# Patient Record
Sex: Female | Born: 1935 | Race: White | Hispanic: No
Health system: Southern US, Community
[De-identification: ages and names within clinical notes are randomized; demographics above are authoritative.]

---

## 2013-09-25 LAB — COMPREHENSIVE METABOLIC PANEL
ALK PHOS: 83 U/L
Albumin: 4.4 g/dL (ref 3.4–5.0)
Anion Gap: 5 — ABNORMAL LOW (ref 7–16)
BUN: 20 mg/dL — AB (ref 7–18)
Bilirubin,Total: 0.5 mg/dL (ref 0.2–1.0)
CHLORIDE: 107 mmol/L (ref 98–107)
CO2: 27 mmol/L (ref 21–32)
CREATININE: 1.02 mg/dL (ref 0.60–1.30)
Calcium, Total: 9 mg/dL (ref 8.5–10.1)
EGFR (African American): 59 — ABNORMAL LOW
GFR CALC NON AF AMER: 50 — AB
Glucose: 167 mg/dL — ABNORMAL HIGH (ref 65–99)
Osmolality: 284 (ref 275–301)
Potassium: 3.9 mmol/L (ref 3.5–5.1)
SGOT(AST): 28 U/L (ref 15–37)
SGPT (ALT): 21 U/L (ref 12–78)
Sodium: 139 mmol/L (ref 136–145)
TOTAL PROTEIN: 8.1 g/dL (ref 6.4–8.2)

## 2013-09-25 LAB — CBC WITH DIFFERENTIAL/PLATELET
BASOS ABS: 0.1 10*3/uL (ref 0.0–0.1)
Basophil %: 0.4 %
EOS ABS: 0.2 10*3/uL (ref 0.0–0.7)
EOS PCT: 1.2 %
HCT: 49 % — ABNORMAL HIGH (ref 35.0–47.0)
HGB: 15.6 g/dL (ref 12.0–16.0)
LYMPHS ABS: 0.7 10*3/uL — AB (ref 1.0–3.6)
Lymphocyte %: 3.8 %
MCH: 29.9 pg (ref 26.0–34.0)
MCHC: 31.8 g/dL — AB (ref 32.0–36.0)
MCV: 94 fL (ref 80–100)
Monocyte #: 1.4 x10 3/mm — ABNORMAL HIGH (ref 0.2–0.9)
Monocyte %: 7.9 %
NEUTROS PCT: 86.7 %
Neutrophil #: 15.8 10*3/uL — ABNORMAL HIGH (ref 1.4–6.5)
Platelet: 247 10*3/uL (ref 150–440)
RBC: 5.22 10*6/uL — AB (ref 3.80–5.20)
RDW: 12.3 % (ref 11.5–14.5)
WBC: 18.2 10*3/uL — AB (ref 3.6–11.0)

## 2013-09-25 LAB — URINALYSIS, COMPLETE
Bilirubin,UR: NEGATIVE
Glucose,UR: NEGATIVE mg/dL (ref 0–75)
Hyaline Cast: 4
Nitrite: NEGATIVE
Ph: 5 (ref 4.5–8.0)
Protein: 30
RBC,UR: 8 /HPF (ref 0–5)
Specific Gravity: 1.025 (ref 1.003–1.030)
Squamous Epithelial: 7
Transitional Epi: <1

## 2013-09-25 LAB — LIPASE, BLOOD: LIPASE: 152 U/L (ref 73–393)

## 2013-09-26 ENCOUNTER — Inpatient Hospital Stay: Payer: Self-pay | Admitting: Internal Medicine

## 2013-09-26 LAB — CK TOTAL AND CKMB (NOT AT ARMC)
CK, Total: 54 U/L
CK-MB: 0.9 ng/mL (ref 0.5–3.6)

## 2013-09-26 LAB — TROPONIN I: Troponin-I: <0.02 ng/mL

## 2013-09-27 LAB — CBC WITH DIFFERENTIAL/PLATELET
Basophil #: 0 10*3/uL (ref 0.0–0.1)
Basophil %: 0.2 %
Eosinophil #: 0.1 10*3/uL (ref 0.0–0.7)
Eosinophil %: 1.7 %
HCT: 36.1 % (ref 35.0–47.0)
HGB: 11.7 g/dL — AB (ref 12.0–16.0)
Lymphocyte #: 1.3 10*3/uL (ref 1.0–3.6)
Lymphocyte %: 18.1 %
MCH: 30.6 pg (ref 26.0–34.0)
MCHC: 32.5 g/dL (ref 32.0–36.0)
MCV: 94 fL (ref 80–100)
MONO ABS: 1 x10 3/mm — AB (ref 0.2–0.9)
MONOS PCT: 13.6 %
Neutrophil #: 4.9 10*3/uL (ref 1.4–6.5)
Neutrophil %: 66.4 %
Platelet: 168 10*3/uL (ref 150–440)
RBC: 3.83 10*6/uL (ref 3.80–5.20)
RDW: 12.6 % (ref 11.5–14.5)
WBC: 7.3 10*3/uL (ref 3.6–11.0)

## 2013-09-27 LAB — CLOSTRIDIUM DIFFICILE(ARMC)

## 2013-09-27 LAB — BASIC METABOLIC PANEL
Anion Gap: 4 — ABNORMAL LOW (ref 7–16)
BUN: 9 mg/dL (ref 7–18)
CO2: 24 mmol/L (ref 21–32)
Calcium, Total: 7.3 mg/dL — ABNORMAL LOW (ref 8.5–10.1)
Chloride: 117 mmol/L — ABNORMAL HIGH (ref 98–107)
Creatinine: 0.78 mg/dL (ref 0.60–1.30)
GLUCOSE: 99 mg/dL (ref 65–99)
OSMOLALITY: 287 (ref 275–301)
POTASSIUM: 3.3 mmol/L — AB (ref 3.5–5.1)
SODIUM: 145 mmol/L (ref 136–145)

## 2013-09-28 LAB — WBCS, STOOL

## 2013-09-29 LAB — STOOL CULTURE

## 2014-10-01 NOTE — H&P (Signed)
PATIENT NAME:  Heather Mclaughlin, Heather Mclaughlin MR#:  [id number] DATE OF BIRTH:  01-15-1928  DATE OF ADMISSION:  09/26/2013  PRIMARY CARE PHYSICIAN: Nonlocal.   REFERRING PHYSICIAN:  .  CHIEF COMPLAINT: Nausea, vomiting and abdominal pain and diarrhea.   HISTORY OF PRESENT ILLNESS: Heather Mclaughlin is a 81 year old pleasant, white female with history of hypertension, hyperlipidemia, who is visiting her daughter from [place], started to have nausea, vomiting, diarrhea since last night at around 7:00 p.m. Two days back the patient went to [place] area, ate burger at  [employer]. The patient went along with her friend, who is also experiencing similar symptoms. The patient initially attributed this to stomach spasms, took some Zofran without much improvement. As the patient continued to have numerous episodes of vomiting the patient came to the Emergency Department. CT abdomen and pelvis done in the Emergency Department shows the patient has enterocolitis with the thickening of the wall of the distal small bowel and colon with fluid filled. The patient is also found to have elevated WBC count of 18,000. The patient has mild UTI with a 2+ leukocyte esterase and WBC of 8. The patient complains abdominal pain diffusely.   PAST MEDICAL HISTORY: 1. Osteoarthritis.  2. Hypertension.  3. Hypothyroidism.  4. Atrial fibrillation.   ALLERGIES: No known drug allergies.   HOME MEDICATIONS: 1. Zofran 8 mg 1 tablet 2 times a day.  2. Simvastatin 10 mg once a day.  3. Reglan 10 mg  4 times a day.  4. Prilosec 20 mg once a day.  5. Pradaxa 150 mg 2 times a day.  6. Levoxyl 88 mcg once a day.  7. Flecainide 50 mg every 12 hours.  8. Coreg 25 mg 2 times a day.   SOCIAL HISTORY: No history of smoking, drinking alcohol or using illicit drugs. Lives with her son in [place].   FAMILY HISTORY: Hypertension, hypothyroidism.   REVIEW OF SYSTEMS: CONSTITUTIONAL: Generalized weakness.  EYES: No change in vision.  ENT: No change  in hearing.  RESPIRATORY: No cough, shortness of breath.  CARDIOVASCULAR: No chest pain, palpations.  GASTROINTESTINAL: Has nausea, vomiting and diarrhea.  GENITOURINARY: No dysuria or hematuria.  MUSCULOSKELETAL: No joint pains and aches. Has history of osteoarthritis.  SKIN: No rashes or lesions.  NEUROLOGIC: No weakness or numbness in any part of the body.   PHYSICAL EXAMINATION: GENERAL: This is a well built, well-nourished, age-appropriate female lying down in the bed, not in distress.  VITAL SIGNS: Temperature 97.9, pulse 88, blood pressure 179/75, respiratory rate of 20, oxygen saturation is 98% on room air.  HEENT: Head normocephalic, atraumatic. There is no scleral icterus. Conjunctivae normal. Pupils equal and react to light. Extraocular movements are intact. Mucous membranes dry. No pharyngeal erythema.  NECK: Supple. No lymphadenopathy. No JVD. No carotid bruit. No thyromegaly.  CHEST: Has no focal tenderness.  LUNGS: Clear to auscultation.  HEART: S1, S2 regular. No murmurs are heard.  ABDOMEN: Bowel sounds present. Soft, mildly tender diffusely. No guarding or rebound tenderness. Could not appreciate any hepatosplenomegaly.  EXTREMITIES: No pedal edema. Pulses 2+. SKIN: No rash or lesions.  MUSCULOSKELETAL: Good range of motion in all the extremities.  NEUROLOGICAL: The patient is alert, oriented to place, person and time. Cranial nerves II through XII intact. Motor 5/5 in upper and lower extremities.   LABORATORIES: Lipase 152. Complete metabolic panel is completely within normal limits. CBC: WBC of 18.2, hemoglobin 15.6, platelet count of 247. Urinalysis, 2+ leukocyte esterase, WBC of 8, trace bacteria.  CT abdomen and pelvis consistent with enterocolitis.   ASSESSMENT AND PLAN: Heather Mclaughlin is a 81 year old female who comes with gastroenteritis from food intoxication.  1. Enterocolitis with gastritis. Keep the patient n.p.o. Continue with IV fluids. Keep the patient on  ciprofloxacin and Flagyl.  2. Hypertension, poorly controlled as the patient is unable to keep down any of her medications. We will keep her on p.r.n. antihypertensive medications if blood pressure goes over 160 mmHg.  3. Atrial fibrillation: Currently rate is well controlled. The patient is on Pradaxa for anticoagulation.  4. Urinary tract infection, mild Cipro should cover this. We will also obtain urine cultures.  5. The patient is on Pradaxa which should cover for deep vein prophylaxis.   TIME SPENT: 50 minutes.    ____________________________ Susa GriffinsPadmaja Legrand Lasser, MD pv:sg D: 09/26/2013 07:44:00 ET T: 09/26/2013 08:17:08 ET JOB#: [id number]  cc: Susa GriffinsPadmaja Ayriana Wix, MD, <Dictator> Susa GriffinsPADMAJA Yvette Roark MD ELECTRONICALLY SIGNED 10/07/2013 20:54

## 2014-10-01 NOTE — Discharge Summary (Signed)
PATIENT NAME:  Heather Mclaughlin, Scarlett MR#:  [id number] DATE OF BIRTH:  06-14-1927  DATE OF ADMISSION:  09/26/2013 DATE OF DISCHARGE:  09/28/2013  PRESENTING COMPLAINT: Nausea, vomiting and diarrhea.   DISCHARGE DIAGNOSES:  1.  Acute enterocolitis, improved.  2.  Chronic atrial fibrillation, on Pradaxa.  3.  Hypertension.  4.  Chronic atrial fibrillation.  CODE STATUS: Full code.  DISCHARGE MEDICATIONS:   1.  Pradaxa 150 mg p.o. b.i.d. 2.  Simvastatin 10 mg at bedtime. 3.  Levoxyl 88 mcg p.o. daily. 4.  Flecainide 50 mg b.i.d. 5.  Carvedilol 25 mg b.i.d. 6.  Zofran 8 mg 1 tablet b.i.d. 7.  Reglan 10 mg 1 tablet 4 times a day as needed. 8.  Prilosec 20 mg daily. 9.  Flagyl 500 mg 1 tablet 3 times a day. 10. Cipro 500 mg b.i.d.   DISCHARGE DIET: Low-sodium, mechanical soft.   FOLLOWUP: With PCP upon return to [place].   Stool for C. difficile was negative. Stool culture was negative. White count was 7.3.   BRIEF SUMMARY OF HOSPITAL COURSE: [name] is an 81 year old Caucasian female with history of chronic atrial fibrillation, on Pradaxa and hypertension, who came in with nausea, vomiting and diarrhea. She was admitted with:  1.  Acute enterocolitis. She was initially hypotensive with blood pressure in the 80s. She was started on Flagyl and Cipro. C. difficile and stool cultures were negative. White count is normal. Her stool was more formed prior to discharge.  2.  A. Fib. Continued Coreg, Flecainide and Pradaxa.  3.  Chronic nausea. Continued Zofran and Reglan.  4.  Hospital stay otherwise remained stable.   CODE STATUS: The patient remained a full code.   TIME SPENT: 40 minutes.  ____________________________ Wylie HailSona A. Allena KatzPatel, MD sap:aw D: 09/29/2013 07:12:13 ET T: 09/29/2013 07:51:30 ET JOB#: [id number]  cc: Farhan Jean A. Allena KatzPatel, MD, <Dictator> Willow OraSONA A Braelee Herrle MD ELECTRONICALLY SIGNED 10/04/2013 9:13

## 2015-10-06 IMAGING — CT CT ABD-PELV W/ CM
2 of 6 series · 15 of 46 positions shown, 17 images · IV contrast (agent unspecified)
Comparison: None.

CLINICAL DATA: Nausea and vomiting for 4 hours, possibly related to
a food allergy.

EXAM:
CT ABDOMEN AND PELVIS WITH CONTRAST
TECHNIQUE: Multidetector CT imaging of the abdomen and pelvis was performed
using the standard protocol following bolus administration of
intravenous contrast.
CONTRAST:  100 mL [id number]

[Series 2: routine abd pel with · axial · 0.77mm/px · z∈[-916,-486]mm · 12 of 98 slices shown, 14 images]
[im 6/98  soft-tissue]
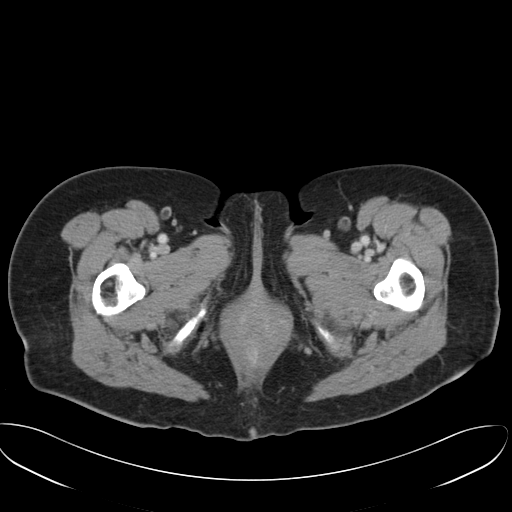
[im 6/98  bone]
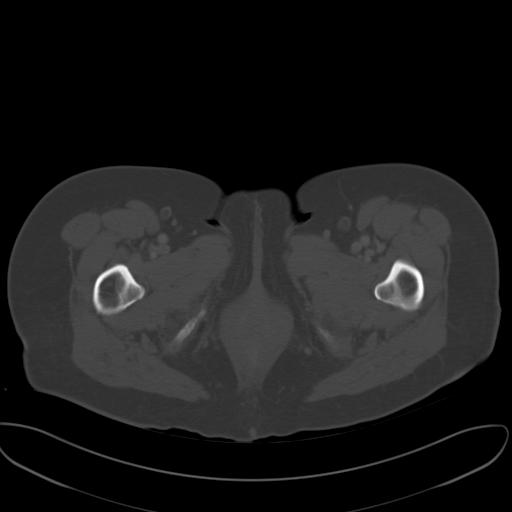
[im 17/98  soft-tissue]
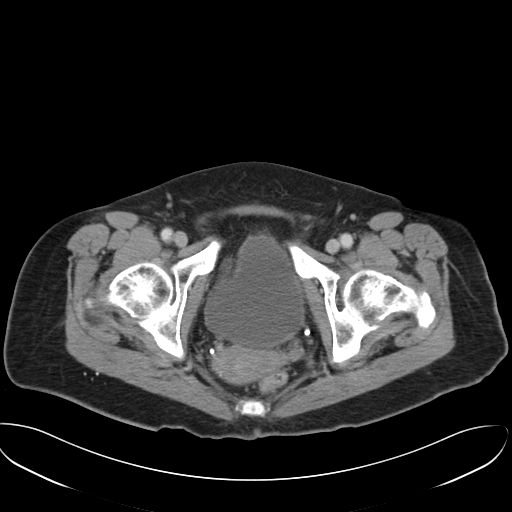
[im 22/98  soft-tissue]
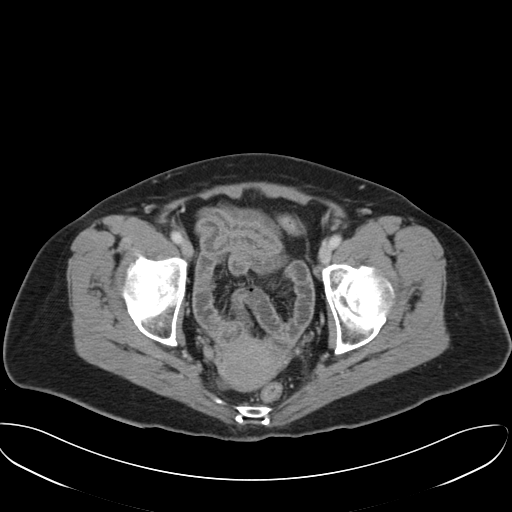
[im 27/98  soft-tissue]
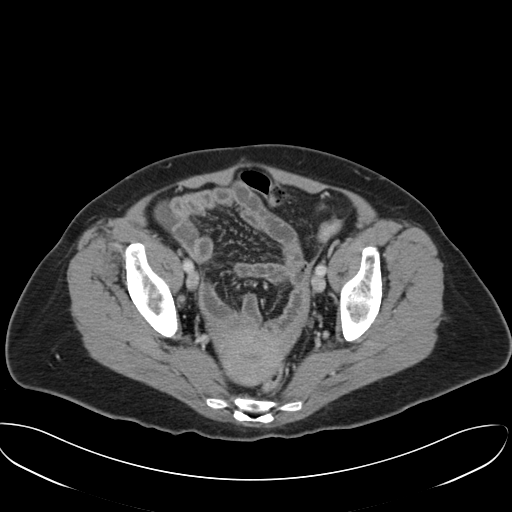
[im 38/98  soft-tissue]
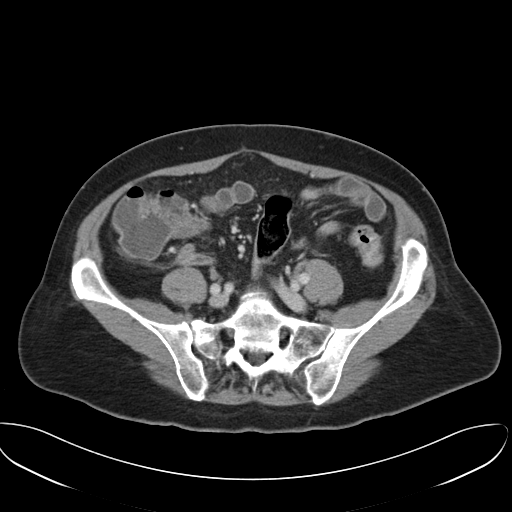
[im 44/98  soft-tissue]
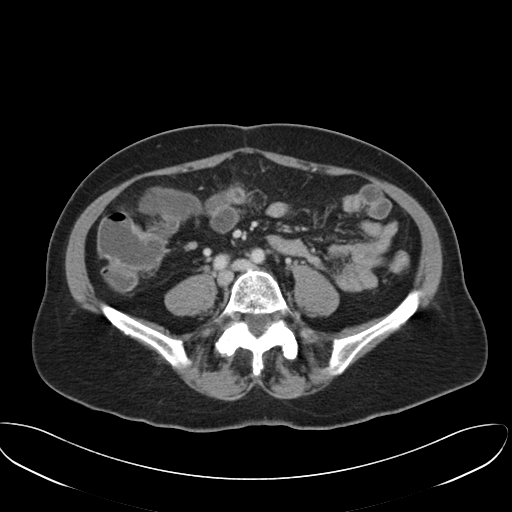
[im 54/98  soft-tissue]
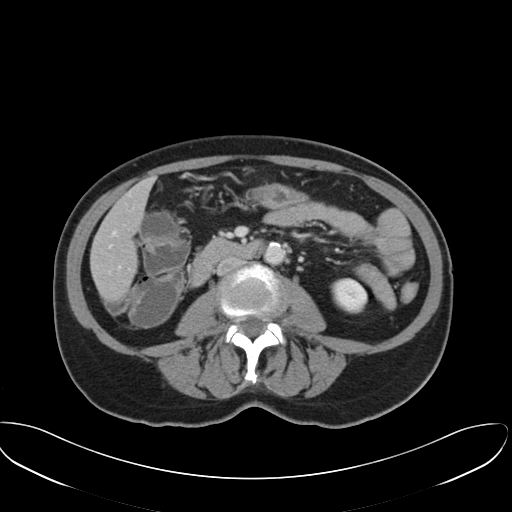
[im 60/98  soft-tissue]
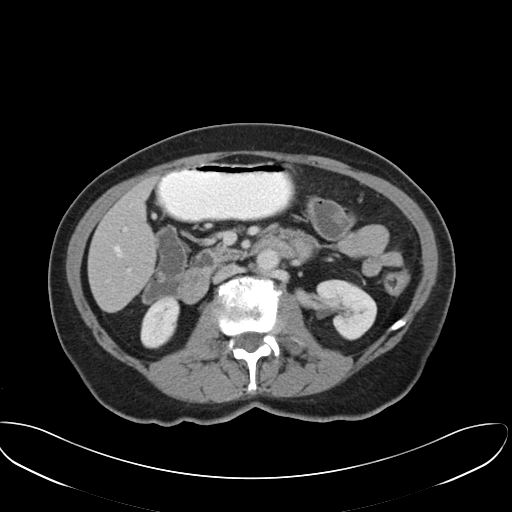
[im 71/98  soft-tissue]
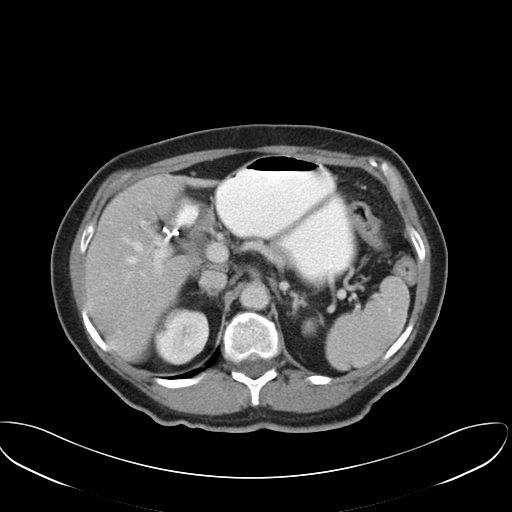
[im 71/98  bone]
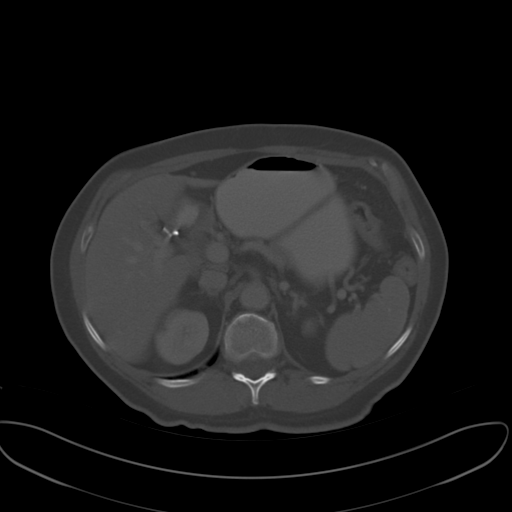
[im 76/98  soft-tissue]
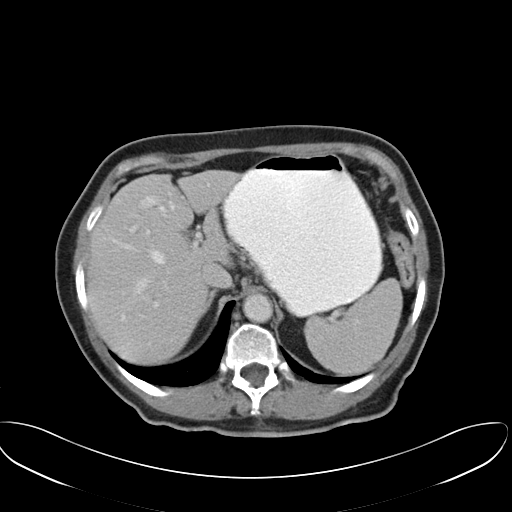
[im 81/98  soft-tissue]
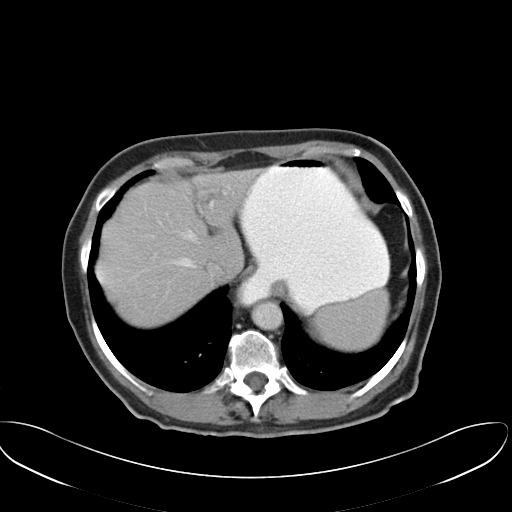
[im 92/98  soft-tissue]
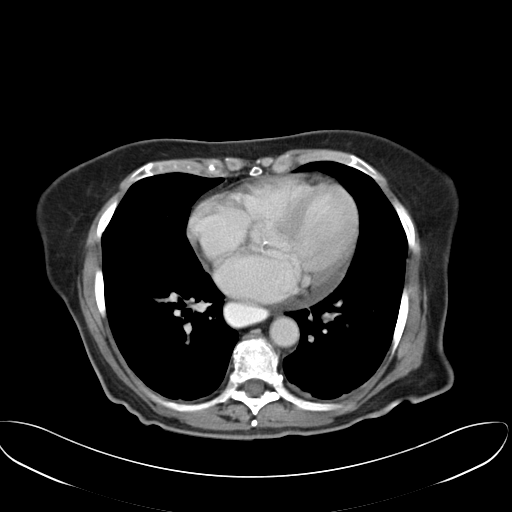

[Series 7: cor routine abd pel with · coronal · 0.65mm/px · 3 of 118 slices shown]
[im 40/118  soft-tissue]
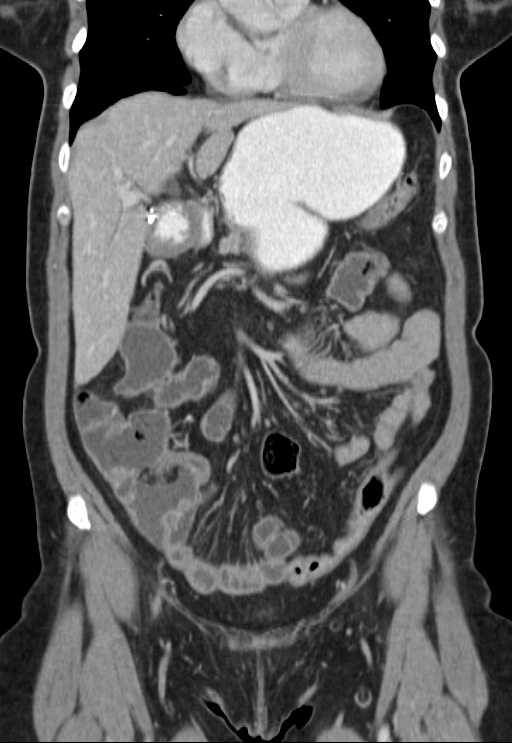
[im 53/118  soft-tissue]
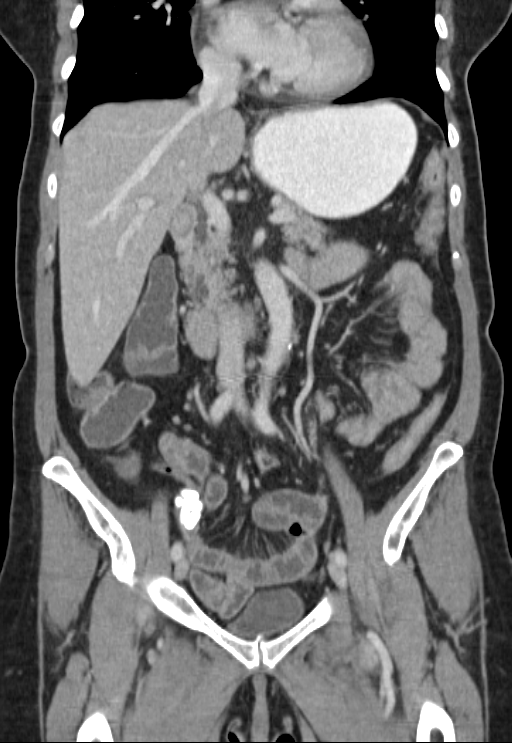
[im 66/118  soft-tissue]
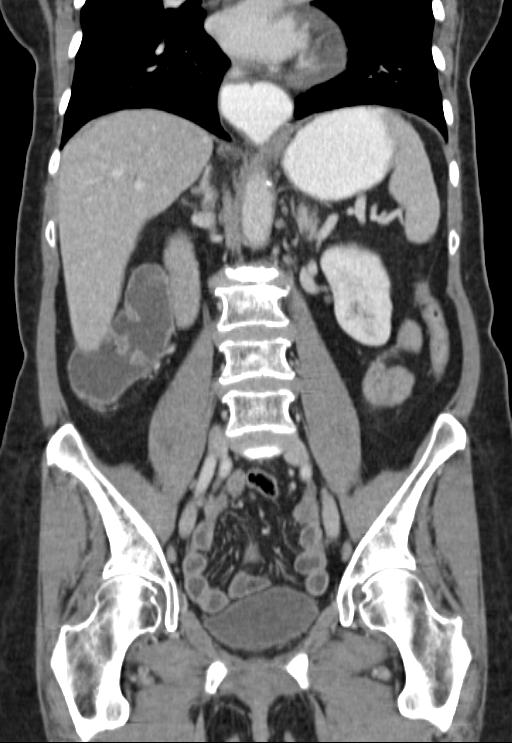

[15 of 46 positions shown; findings below may reference images not displayed]

FINDINGS: Mild interstitial changes in the lung bases, likely fibrosis. Mild
cardiac enlargement. Esophageal hiatal hernia with contrast material
in the lower esophagus consistent with reflux or dysmotility. Small
pericardial effusion.

Surgical absence of the gallbladder. The liver, spleen, pancreas,
adrenal glands, inferior vena cava, and retroperitoneal lymph nodes
are unremarkable. Calcification of aorta without aneurysm. Small
subcentimeter cysts in the kidneys. No hydronephrosis. The stomach
is contrast filled without wall thickening. Small bowel are
decompressed. The colon is not abnormally distended but diffusely
throughout the colon and in the terminal ileum, there is evidence of
mild wall thickening suggesting enterocolitis. The colon is fluid
filled suggesting liquid stool. No inflammatory stranding in the
mesentery. No free air or free fluid in the abdomen.

Pelvis: Uterus and ovaries are not enlarged. Radiopaque densities in
the cecum possibly represent ingested medication. Fatty hypertrophy
of the ileocecal valve. No significant lymphadenopathy in the
pelvis. No free or loculated pelvic fluid collections. Degenerative
changes in the lumbar spine. No destructive bone lesions
appreciated. At
IMPRESSION: Thickening of the wall of the distal small bowel and colon with
fluid-filled: Suggesting enterocolitis. No evidence of bowel
obstruction. Contrast material in the esophagus consistent with
reflux or dysmotility. Small pericardial effusion.
